# Patient Record
Sex: Male | Born: 1985 | Race: White | Hispanic: No | Smoking: Current every day smoker
Health system: Southern US, Community
[De-identification: ages and names within clinical notes are randomized; demographics above are authoritative.]

## PROBLEM LIST (undated history)

## (undated) DIAGNOSIS — F250 Schizoaffective disorder, bipolar type: Secondary | ICD-10-CM

## (undated) DIAGNOSIS — F319 Bipolar disorder, unspecified: Secondary | ICD-10-CM

## (undated) DIAGNOSIS — F259 Schizoaffective disorder, unspecified: Secondary | ICD-10-CM

## (undated) DIAGNOSIS — F988 Other specified behavioral and emotional disorders with onset usually occurring in childhood and adolescence: Secondary | ICD-10-CM

## (undated) HISTORY — PX: TONSILLECTOMY: SUR1361

---

## 2009-08-29 ENCOUNTER — Inpatient Hospital Stay: Payer: Self-pay | Admitting: Unknown Physician Specialty

## 2009-11-15 ENCOUNTER — Emergency Department: Payer: Self-pay | Admitting: Unknown Physician Specialty

## 2015-12-11 ENCOUNTER — Emergency Department (HOSPITAL_COMMUNITY)
Admission: EM | Admit: 2015-12-11 | Discharge: 2015-12-25 | Disposition: A | Discharge status: Home / Self Care | Payer: Medicaid Other | Attending: Psychiatry | Admitting: Psychiatry

## 2015-12-11 ENCOUNTER — Encounter (HOSPITAL_COMMUNITY): Payer: Self-pay | Admitting: Emergency Medicine

## 2015-12-11 DIAGNOSIS — Z79899 Other long term (current) drug therapy: Secondary | ICD-10-CM | POA: Insufficient documentation

## 2015-12-11 DIAGNOSIS — R45851 Suicidal ideations: Secondary | ICD-10-CM | POA: Diagnosis present

## 2015-12-11 DIAGNOSIS — Z9889 Other specified postprocedural states: Secondary | ICD-10-CM | POA: Diagnosis not present

## 2015-12-11 DIAGNOSIS — R45850 Homicidal ideations: Secondary | ICD-10-CM | POA: Diagnosis not present

## 2015-12-11 DIAGNOSIS — Z888 Allergy status to other drugs, medicaments and biological substances status: Secondary | ICD-10-CM | POA: Diagnosis not present

## 2015-12-11 DIAGNOSIS — F3163 Bipolar disorder, current episode mixed, severe, without psychotic features: Secondary | ICD-10-CM | POA: Diagnosis not present

## 2015-12-11 DIAGNOSIS — T1490XA Injury, unspecified, initial encounter: Secondary | ICD-10-CM

## 2015-12-11 DIAGNOSIS — F17210 Nicotine dependence, cigarettes, uncomplicated: Secondary | ICD-10-CM | POA: Diagnosis not present

## 2015-12-11 HISTORY — DX: Other specified behavioral and emotional disorders with onset usually occurring in childhood and adolescence: F98.8

## 2015-12-11 HISTORY — DX: Bipolar disorder, unspecified: F31.9

## 2015-12-11 HISTORY — DX: Schizoaffective disorder, unspecified: F25.9

## 2015-12-11 HISTORY — DX: Schizoaffective disorder, bipolar type: F25.0

## 2015-12-11 LAB — CBC
HEMATOCRIT: 42.3 % (ref 39.0–52.0)
Hemoglobin: 14.4 g/dL (ref 13.0–17.0)
MCH: 30.6 pg (ref 26.0–34.0)
MCHC: 34 g/dL (ref 30.0–36.0)
MCV: 90 fL (ref 78.0–100.0)
PLATELETS: 294 10*3/uL (ref 150–400)
RBC: 4.7 MIL/uL (ref 4.22–5.81)
RDW: 13.6 % (ref 11.5–15.5)
WBC: 8.8 10*3/uL (ref 4.0–10.5)

## 2015-12-11 LAB — COMPREHENSIVE METABOLIC PANEL
ALBUMIN: 4.2 g/dL (ref 3.5–5.0)
ALT: 20 U/L (ref 17–63)
AST: 20 U/L (ref 15–41)
Alkaline Phosphatase: 73 U/L (ref 38–126)
Anion gap: 7 (ref 5–15)
BUN: 13 mg/dL (ref 6–20)
CHLORIDE: 108 mmol/L (ref 101–111)
CO2: 26 mmol/L (ref 22–32)
Calcium: 9.4 mg/dL (ref 8.9–10.3)
Creatinine, Ser: 1.05 mg/dL (ref 0.61–1.24)
GFR calc Af Amer: >60 mL/min (ref >60)
GFR calc non Af Amer: >60 mL/min (ref >60)
GLUCOSE: 88 mg/dL (ref 65–99)
POTASSIUM: 4.2 mmol/L (ref 3.5–5.1)
SODIUM: 141 mmol/L (ref 135–145)
Total Bilirubin: 0.4 mg/dL (ref 0.3–1.2)
Total Protein: 6.8 g/dL (ref 6.5–8.1)

## 2015-12-11 LAB — ETHANOL: Alcohol, Ethyl (B): <5 mg/dL (ref <5)

## 2015-12-11 LAB — ACETAMINOPHEN LEVEL: Acetaminophen (Tylenol), Serum: <10 ug/mL — ABNORMAL LOW (ref 10–30)

## 2015-12-11 LAB — RAPID URINE DRUG SCREEN, HOSP PERFORMED
AMPHETAMINES: POSITIVE — AB
BENZODIAZEPINES: NOT DETECTED
Barbiturates: NOT DETECTED
COCAINE: NOT DETECTED
Opiates: NOT DETECTED
Tetrahydrocannabinol: POSITIVE — AB

## 2015-12-11 LAB — SALICYLATE LEVEL: Salicylate Lvl: <7 mg/dL (ref 2.8–30.0)

## 2015-12-11 LAB — CBG MONITORING, ED: GLUCOSE-CAPILLARY: 130 mg/dL — AB (ref 65–99)

## 2015-12-11 LAB — LITHIUM LEVEL: Lithium Lvl: 0.25 mmol/L — ABNORMAL LOW (ref 0.60–1.20)

## 2015-12-11 MED ORDER — LITHIUM CARBONATE 300 MG PO CAPS
1200.0000 mg | ORAL_CAPSULE | Freq: Every day | ORAL | Status: DC
  Administered 2015-12-11 – 2015-12-18 (×8): 1200 mg via ORAL
  Filled 2015-12-11 (×8): qty 4

## 2015-12-11 MED ORDER — OLANZAPINE 5 MG PO TABS
15.0000 mg | ORAL_TABLET | Freq: Every day | ORAL | Status: DC
  Administered 2015-12-11 – 2015-12-18 (×8): 15 mg via ORAL
  Filled 2015-12-11 (×4): qty 3
  Filled 2015-12-11: qty 1
  Filled 2015-12-11 (×3): qty 3

## 2015-12-11 MED ORDER — AMPHETAMINE-DEXTROAMPHETAMINE 10 MG PO TABS
15.0000 mg | ORAL_TABLET | Freq: Every day | ORAL | Status: DC
  Administered 2015-12-16 – 2015-12-18 (×3): 15 mg via ORAL
  Filled 2015-12-11 (×4): qty 2

## 2015-12-11 NOTE — ED Triage Notes (Signed)
Pt states that he got mad and  Tore up a group home  Yesterday and pt is [facility]  [facility], pt served with IVC papers, pt states he wants to kill himself

## 2015-12-11 NOTE — ED Notes (Signed)
Patient denies suicidal ideations.  When asked if he has homicidal ideations he states "I have a thing about rapists and serial killers.  I just can't stand them."  Patient refused to to answer further questions at this time and turned his body to face away from this nurse.

## 2015-12-11 NOTE — ED Provider Notes (Signed)
[facility]-EMERGENCY DEPT Provider Note   CSN: [id number] Arrival date & time: 12/11/15  1256     History   Chief Complaint Chief Complaint  Patient presents with  . Suicidal    HPI Andrew Mathews is a 30 y.o. male.  HPI  30 year old male presents today with suicidal and homicidal ideations. Patient is IVC, reports that he hears voices that other stone, and sees things in the future. He reports that he is very homicidal towards "rapists and criminals" very denies any homicidal activity, reports he occasionally has suicidal ideations none today. Patient reports that he is most safe that A inpatient facility. He notes he's been hospitalized for this in the past. Patient reports that he is taking his antipsychotic medication as directed, reports using marijuana, no other drugs. Patient denies any physical complaints, reports he's feeling well, no fever, chills nausea or vomiting.  Past Medical History:  Diagnosis Date  . Attention deficit disorder (ADD)   . Bipolar 1 disorder (HCC)   . Schizo affective schizophrenia (HCC)     There are no active problems to display for this patient.   Past Surgical History:  Procedure Laterality Date  . TONSILLECTOMY         Home Medications    Prior to Admission medications   Not on File    Family History History reviewed. No pertinent family history.  Social History Social History  Substance Use Topics  . Smoking status: Current Every Day Smoker  . Smokeless tobacco: Never Used  . Alcohol use Not on file     Allergies   Depakote er [divalproex sodium er]; Haldol [haloperidol]; Prolixin [fluphenazine]; and Wellbutrin [bupropion]   Review of Systems Review of Systems  All other systems reviewed and are negative.    Physical Exam Updated Vital Signs BP 133/80 (BP Location: Left Arm)   Pulse 100   Temp 98.4 F (36.9 C) (Oral)   Resp 16   SpO2 100%   Physical Exam  Constitutional: He is oriented to person, place, and  time. He appears well-developed and well-nourished.  HENT:  Head: Normocephalic and atraumatic.  Eyes: Conjunctivae are normal. Pupils are equal, round, and reactive to light. Right eye exhibits no discharge. Left eye exhibits no discharge. No scleral icterus.  Neck: Normal range of motion. No JVD present. No tracheal deviation present.  Pulmonary/Chest: Effort normal. No stridor.  Neurological: He is alert and oriented to person, place, and time. Coordination normal.  Psychiatric: He has a normal mood and affect. His behavior is normal. Judgment and thought content normal.  Nursing note and vitals reviewed.    ED Treatments / Results  Labs (all labs ordered are listed, but only abnormal results are displayed) Labs Reviewed  ACETAMINOPHEN LEVEL - Abnormal; Notable for the following:       Result Value   Acetaminophen (Tylenol), Serum <10 (*)    All other components within normal limits  RAPID URINE DRUG SCREEN, HOSP PERFORMED - Abnormal; Notable for the following:    Amphetamines POSITIVE (*)    Tetrahydrocannabinol POSITIVE (*)    All other components within normal limits  CBG MONITORING, ED - Abnormal; Notable for the following:    Glucose-Capillary 130 (*)    All other components within normal limits  COMPREHENSIVE METABOLIC PANEL  ETHANOL  SALICYLATE LEVEL  CBC    EKG  EKG Interpretation None       Radiology No results found.  Procedures Procedures (including critical care time)  Medications Ordered in ED  Medications - No data to display   Initial Impression / Assessment and Plan / ED Course  I have reviewed the triage vital signs and the nursing notes.  Pertinent labs & imaging results that were available during my care of the patient were reviewed by me and considered in my medical decision making (see chart for details).  Clinical Course     Patient presents with homicidal ideations. He has no physical complaints, he is medically cleared and will need  TTS evaluation  Final Clinical Impressions(s) / ED Diagnoses   Final diagnoses:  Homicidal ideations    New Prescriptions New Prescriptions   No medications on file     Eyvonne MechanicJeffrey Xara Paulding, PA-C 12/11/15 1634    Laurence Spatesachel Morgan Little, MD 12/17/15 [phone]

## 2015-12-11 NOTE — ED Notes (Signed)
Reports given to pod c rn clark

## 2015-12-11 NOTE — BH Assessment (Signed)
Pt denied at [facility] due to capacity. 

## 2015-12-11 NOTE — Progress Notes (Signed)
Referrals were submitted to the Following Hospitals: [facility], [facility], [facility], [facility], [facility], [facility], [facility], [facility], [facility], [facility], [facility], [address], [address], [facility], [facility], [facility], [facility], [facility], [facility], [facility], [facility], [facility], [facility], [facility]  Tyera Hansley K. Sherlon HandingHarris, LCAS-A, LPC-A, [facility]  Counselor 12/11/2015 7:44 PM

## 2015-12-11 NOTE — ED Notes (Signed)
Lunch tray ordered; regular diet, no sharps 
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
## 2015-12-12 NOTE — ED Notes (Signed)
Sat with pt while sitter went on break, no issues.

## 2015-12-12 NOTE — ED Notes (Signed)
Placed breakfast order for pt, per request.  Low-cholesterol eggs, Malawiturkey sausage patty, blueberry muffin, [place] Cheerios, apple, honey, soymilk, coffee

## 2015-12-12 NOTE — ED Notes (Signed)
Pt's lunch ordered 

## 2015-12-12 NOTE — BH Assessment (Signed)
Pt declined @ [facility] due to Substance use.

## 2015-12-12 NOTE — Progress Notes (Signed)
This chart was reviewed for the purpose of placement.     Maryelizabeth Rowanressa Cori Justus, MSW, Tinnie GensLCSW, LCAS, CCSI [facility] Triage Specialist [phone]

## 2015-12-12 NOTE — ED Notes (Signed)
Pt given shower supplies, new scrubs, and socks for shower. Pt given soap that he brought from home for his eczema, per RN.

## 2015-12-12 NOTE — ED Notes (Signed)
Dinner Ordered 

## 2015-12-12 NOTE — ED Notes (Signed)
Sat with pt while sitter went to lunch

## 2015-12-12 NOTE — ED Notes (Signed)
Pt up to take shower.  No problems noted

## 2015-12-12 NOTE — ED Notes (Signed)
Gave pt ginger ale.  

## 2015-12-12 NOTE — Progress Notes (Signed)
Jaylene at [facility] reports that patient is being reviewed for possible placement.  Melbourne Abtsatia Auden Tatar, LCSWA Disposition staff 12/12/2015 12:19 PM

## 2015-12-12 NOTE — ED Notes (Signed)
Pt reports he has been having intermittent chest pain since he has been here but did not inform anyone or the ER doctors. EKG performed and hand delivered to Dr. Clydene PughKnott. No new verbal orders at this time.

## 2015-12-13 MED ORDER — OLANZAPINE 5 MG PO TBDP
5.0000 mg | ORAL_TABLET | ORAL | Status: AC | PRN
Start: 2015-12-13 — End: 2015-12-13
  Administered 2015-12-13 (×2): 5 mg via ORAL
  Filled 2015-12-13 (×2): qty 1

## 2015-12-13 NOTE — ED Triage Notes (Signed)
When handing PT the 5mg  olanzapine tab . Pt reported tab was bigger than last tab . Pt was looked at  the pill package with name and dose. Pt then reported the last pill must have been 2.5 dose. Pt returned to his room pacing and talking out loud to himself.
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
## 2015-12-13 NOTE — ED Notes (Signed)
Pt in room eating meal tray.

## 2015-12-13 NOTE — ED Notes (Signed)
Patient laying in bed.  

## 2015-12-13 NOTE — ED Notes (Addendum)
Pt to restroom returns to room.continues to pace around room punched wall asked pt to stop ,pt complied. Talking to self .

## 2015-12-13 NOTE — ED Notes (Signed)
Pt in bed sleeping

## 2015-12-13 NOTE — ED Notes (Addendum)
Pt up to restroom and returns to room. Pt to receive lunch tray in bed eating.

## 2015-12-13 NOTE — ED Triage Notes (Signed)
EDP  Andrew Mathews  At Pt bed side to assess Pt.

## 2015-12-13 NOTE — ED Notes (Signed)
Pt given crackers, peanut butter and soda. Cont. Pacing in room.

## 2015-12-13 NOTE — ED Notes (Signed)
Assigned RN over to speaking with Dr Effie ShyWentz for pt eval

## 2015-12-13 NOTE — ED Triage Notes (Signed)
Bed side table and chair removed from room . Pt continues to paceing in room.

## 2015-12-13 NOTE — ED Notes (Signed)
Pt pacing and tearful in room. Security remains outside of room.

## 2015-12-13 NOTE — ED Notes (Signed)
Pt received breakfast and completed all.

## 2015-12-13 NOTE — ED Notes (Addendum)
Pt continues pacing in room. Security remains.

## 2015-12-13 NOTE — ED Notes (Signed)
Patient slamming cabinet doors, requested to stop. RN asked patient if he was agitated and patient yelling " Yes I am agitated and slamming doors". Security called. Patient given space.

## 2015-12-13 NOTE — ED Triage Notes (Addendum)
PT continues to pac in room. Next dose of zyprexa due at 1030.

## 2015-12-13 NOTE — BH Assessment (Signed)
Per [place], [facility] is [facility] the patient for placement.

## 2015-12-13 NOTE — ED Notes (Signed)
Pt continues pacing in room.

## 2015-12-13 NOTE — ED Notes (Signed)
telepysch in progress. 

## 2015-12-13 NOTE — ED Triage Notes (Signed)
PT contiunes to pace in room waving arms above head. . Pt walked to bathroom.

## 2015-12-13 NOTE — ED Notes (Signed)
telepysch completed pt in bed.

## 2015-12-13 NOTE — BHH Counselor (Signed)
Attempted reassessment of Pt.  Per attending nurse's report, Pt was aggressive this morning and as given medication to calm him.  He is too drowsy for reassessment.

## 2015-12-13 NOTE — ED Triage Notes (Signed)
PT resting in his bed now.
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
## 2015-12-13 NOTE — ED Notes (Signed)
Dr.Wentz at bedside, pt continues pacing.

## 2015-12-13 NOTE — ED Notes (Signed)
Pt in bed attempting to sleep.

## 2015-12-13 NOTE — ED Notes (Addendum)
Pt pacing around room and talking to himself cursing Andrew ManisElizabeth RN aware at nurses station.

## 2015-12-13 NOTE — ED Notes (Signed)
Pt to use restroom cont. Pacing in room.
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
## 2015-12-14 NOTE — Progress Notes (Signed)
Followed up on inpatient referrals.  [facility] being [facility] (per [facility]) and [facility] ( per [place]) today.   Decl[facility] at [facility] (both due to SA) and [facility] (due to not having IPRS funding).  [facility] upon appropriate bed availability.  Ilean SkillMeghan Debra Calabretta, MSW, LCSW Clinical Social Work, Disposition  12/14/2015 [phone]

## 2015-12-14 NOTE — ED Notes (Signed)
Regular Diet was ordered for patient for Lunch, and a snack and drink was given to patient.

## 2015-12-14 NOTE — ED Notes (Signed)
Pt OOB to BR with steady gait.

## 2015-12-14 NOTE — ED Notes (Signed)
Ordered breakfast tray per pt request. Blueberry pancakes w/butter, Malawiturkey sausage, low-cholesterol eggs, Frosted Flakes, soy milk, coffee (3 creams, 2 sugars), and 3 honey packs.

## 2015-12-15 MED ORDER — LORAZEPAM 1 MG PO TABS
2.0000 mg | ORAL_TABLET | Freq: Once | ORAL | Status: AC
  Administered 2015-12-15: 2 mg via ORAL
  Filled 2015-12-15: qty 2

## 2015-12-15 NOTE — ED Notes (Signed)
TSS called this RN to notify that pt is wait listed for [facility].  Pt's custody order needs to be redone as it is outdated at this time.
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
## 2015-12-15 NOTE — ED Notes (Signed)
Unable to get patient V/S at this time, due To behavior problems.

## 2015-12-15 NOTE — ED Notes (Signed)
Pt is now resting in bed quietly, informed the patient that if he feels like he is going to start feeling that way again to let me know so I can give him something before it escalates again. Verbalized understanding. Patient given a gingerale.

## 2015-12-15 NOTE — ED Notes (Signed)
Patient was given a snack and drink, and a regular diet was taken for dinner.

## 2015-12-15 NOTE — ED Notes (Signed)
tts at bedside 

## 2015-12-15 NOTE — ED Notes (Signed)
Patient was given a snack and a drink, a regular diet was ordered for Lunch.

## 2015-12-15 NOTE — Progress Notes (Signed)
[facility], per Fritzi MandesKirsten. Kirsten from [facility] called back Clinical research associatewriter and informed that pt's custody order has been served late and that it needs to be redone. [facility] RN Philippa ChesterBrittney has been informed.  Authorization number from [employer] has been obtained: [id number] from 12/15/15 through admission date. [facility] and demographics haven been given to [facility].   Andrew Mathews, LCSWA Disposition staff 12/15/2015 7:03 PM

## 2015-12-15 NOTE — ED Notes (Signed)
In reviewing the IVC paperwork with charge nurse, case manager, and social worker, it is been determined that the "custody order" is contained within the IVC paperwork.  The IVC paperwork is still valid at this time and will need to be resubmitted on Thursday 12/15 in the morning.

## 2015-12-15 NOTE — BH Assessment (Signed)
Writer reassessed pt today. Pt wearing scrubs and lying on his side in bed. He is cooperative at first. Pt reports he slept well and he has a good appetite. When writer asks why pt is in ED, pt says, "I don't want to talk about it. I already talked about it." When asked about SI, pt says, "I already told y'al that. I don't want to talk about it. " Pt's affect is blunted. He describes mood as "good, it's alright." Pt reports he was kicked out of his group home, and he has nowhere to go. He asks if he is being admitted to long term care or the state hospital. Writer explains disposition process and tell pt that his referral has been sent to 10+ hospitals. Writer ran pt by Fransisca KaufmannLaura Davis NP who recommends inpatient treatment. TTS continues to seek placement.

## 2015-12-15 NOTE — ED Notes (Signed)
Pt ambulated to the bathroom and ate all of his lunch, denies any complaints

## 2015-12-15 NOTE — ED Notes (Signed)
Pt is still pacing the room, no longer talking to himself or throwing things, security close to bedside.

## 2015-12-15 NOTE — Progress Notes (Addendum)
Spoke with Irish Lackenisha at [facility]. Advises "Tried to call after hours yesterday to accept pt and could not reach anyone." Asks to re-fax referral so that it can be re-considered for admission today.   Ilean SkillMeghan Branko Steeves, MSW, LCSW Clinical Social Work, Disposition  12/15/2015 [phone]  Irish Lackenisha states pt has now been declined for admission, acuity too high for unit. Also declined at [facility] [place], [facility] (all due to SA), [employer] [employer] [facility] (no [employer] funding). [facility] (per Morrie SheldonAshley limited [employer] funding, admissions will review) and [facility] (per Thayer Ohmhris, possibility of one male bed opening later today).  [facility]. Pt listed as uninsured and had [place] address per [employer] representative Lauren, however Lauren states pt currentlly listed in their [place] [employer] system as having [place] MCD (unable to provide MCD number due to confidentiality constraints).  Address in pt's chart lists  address, therefore faxed authorization request to [facility] at [phone]. Noted that [facility] system lists pt uninsured but reportedly [place] [employer] lists pt as having MCD.  Will proceed with [facility] referral once authorization # granted.

## 2015-12-16 ENCOUNTER — Emergency Department (HOSPITAL_COMMUNITY): Payer: Medicaid Other

## 2015-12-16 ENCOUNTER — Inpatient Hospital Stay (HOSPITAL_COMMUNITY)
Admit: 2015-12-16 | Payer: No Typology Code available for payment source | Source: Intra-hospital | Admitting: Psychiatry

## 2015-12-16 DIAGNOSIS — F3163 Bipolar disorder, current episode mixed, severe, without psychotic features: Secondary | ICD-10-CM | POA: Diagnosis present

## 2015-12-16 MED ORDER — LORAZEPAM 1 MG PO TABS
1.0000 mg | ORAL_TABLET | ORAL | Status: AC | PRN
  Administered 2015-12-16: 1 mg via ORAL
  Filled 2015-12-16: qty 1

## 2015-12-16 MED ORDER — ZIPRASIDONE MESYLATE 20 MG IM SOLR
20.0000 mg | INTRAMUSCULAR | Status: AC | PRN
  Administered 2015-12-16: 20 mg via INTRAMUSCULAR
  Filled 2015-12-16: qty 20

## 2015-12-16 MED ORDER — OLANZAPINE 5 MG PO TBDP
5.0000 mg | ORAL_TABLET | Freq: Three times a day (TID) | ORAL | Status: DC | PRN
  Administered 2015-12-16 – 2015-12-18 (×4): 5 mg via ORAL
  Filled 2015-12-16 (×4): qty 1

## 2015-12-16 MED ORDER — STERILE WATER FOR INJECTION IJ SOLN
INTRAMUSCULAR | Status: AC
  Filled 2015-12-16: qty 10

## 2015-12-16 NOTE — ED Notes (Signed)
Pt requesting to shower at this time, given toiletries for showering.

## 2015-12-16 NOTE — ED Notes (Signed)
Dr. Clayborne DanaMesner at bedside updating patient and speaking to patient about his request for prn atarax.

## 2015-12-16 NOTE — ED Notes (Signed)
Pt acting out, punching walls and yelling. EDP is speaking with the pt. Security is outside of room.

## 2015-12-16 NOTE — [facility] Counselor (Signed)
Re-assessment- Pt denies SI. Pt states "I still want to kill pedophiles." Pt states "my brain is not right, I need help." Pt denies AVH.  Andrew PhoenixBrandi Sharniece Gibbon, [facility] Triage Specialist

## 2015-12-16 NOTE — ED Notes (Signed)
Pt has received a snack, lunch order has been taken at this time.  

## 2015-12-16 NOTE — ED Triage Notes (Signed)
PT refuses  zyprexa  At this time.

## 2015-12-16 NOTE — Progress Notes (Signed)
Pt remains on [facility] waiting list per Robinette.   Declined: [facility]  Will continue following and seeking inpatient placement.  Ilean SkillMeghan Carzell Saldivar, MSW, LCSW Clinical Social Work, Disposition  12/16/2015 [phone]

## 2015-12-16 NOTE — ED Notes (Signed)
[facility] until 2200.

## 2015-12-16 NOTE — ED Notes (Signed)
Pt up to restroom.  Continue to reassure.  Patient has moments of maniacal laughing in room, patient has moments of yelling angrily.  Then patient will be calm when reassured by RN

## 2015-12-16 NOTE — ED Notes (Signed)
Pt received breakfast at this time

## 2015-12-16 NOTE — Progress Notes (Signed)
[facility] by Dr. Lucianne MussKumar, attending Dr. Elna BreslowEappen. [facility]  Report # [id number]. Pt under IVC. [facility] aware of plan to transfer tonight.   Ilean SkillMeghan Tyreanna Bisesi, MSW, LCSW Clinical Social Work, Disposition  12/16/2015 [phone]

## 2015-12-16 NOTE — ED Notes (Addendum)
[facility] called to request that ED hold pt while they discuss [facility] care [facility] with [facility].

## 2015-12-16 NOTE — ED Notes (Signed)
Food tray at bedside.

## 2015-12-16 NOTE — ED Provider Notes (Signed)
4:59 PM Reevaluated patient and he wanted to know What the plan of action is. I discussed with him that the plan was to go to behavior health tonight around 8:00 and he is okay with that. He is still having homicidal thoughts that are becoming more pervasive. He is excited about hopefully get some help with this.   Marily MemosJason Laurena Valko, MD 12/16/15 1700

## 2015-12-16 NOTE — ED Notes (Signed)
TTS set up at bedside to assess pt.

## 2015-12-16 NOTE — ED Notes (Signed)
MD made aware of pt's request for consultation on POC.

## 2015-12-16 NOTE — ED Notes (Signed)
Pt eating left overs from food tray.

## 2015-12-16 NOTE — ED Notes (Signed)
Patient pacing in room upon RN arrival.  Pt then struck the table and wall in room.  This RN and Schering-PloughCrystal, RN and Kathlene NovemberMike, RN approached patient.  Patient verbally aggressive and threatening towards staff.  Security paged and at bedside.  Pt standing in room, no longer hitting, but still using aggressive language.  Pt offered Zyprexa and Ativan PO.  Patient considering at this time.  This RN encouraged pt to take PO meds to avoid IM medications.  Patient states "I'm willing to just let that happen.  You all are going to just dope me up and strap me down and laugh at me".   This RN attempted to reassure patient.

## 2015-12-16 NOTE — ED Notes (Signed)
[facility] is refusing to take pt. [facility] AC states that MD Lucianne MussKumar was not aware that pt had become violent. This RN informed to given pt his scheduled antipsychotics and they will reevaluate in the am.

## 2015-12-16 NOTE — ED Notes (Signed)
Report attempted x 1

## 2015-12-17 MED ORDER — LORAZEPAM 1 MG PO TABS
1.0000 mg | ORAL_TABLET | Freq: Four times a day (QID) | ORAL | Status: DC | PRN
  Administered 2015-12-17 – 2015-12-18 (×5): 1 mg via ORAL
  Filled 2015-12-17 (×5): qty 1

## 2015-12-17 NOTE — ED Notes (Signed)
Patient clam at this time. Patient given ice cream as snack. Patient refused to speak for assessment. When ask how does he feel patient states, " I feel Calm". RN then ask patient how has he been sleeping, Patient states, " I been sleeping to much". RN then ask patient how has he has been eating. Patient states Okay. RN then has been seeing anything not there or hearing voices. Patient did not answer, RN then ask if patient was SI / HI patient states I do not want to talk about it.

## 2015-12-17 NOTE — ED Notes (Addendum)
[employer] officer stated to this RN that the pt told him as he first entered the room, "I was hoping to hit a cop." He additionally told the officer, he wants to kill child molesters and child abusers.  That he wanted to protect all of the children.  [employer] officer was able to fully deescalate the pt.

## 2015-12-17 NOTE — ED Notes (Signed)
Patient was tearful, Rn asked patient how was he feeling, patient responded I don't want to talk about RN ask if given some time would he like to talk. Patient states, " [facility]" . RN advised it could take some time for a bed to become available.  Patient then requested Ice Cream RN gave ice cream. Patient Calm at this time.

## 2015-12-17 NOTE — ED Notes (Signed)
Patient dinner at bedside,

## 2015-12-17 NOTE — Progress Notes (Addendum)
RN [facility]nie [facility] informed Clinical research associatewriter that she will call back when she finds bed.  Melbourne Abtsatia Klaire Court, LCSWA Disposition staff 12/17/2015 8:26 AM

## 2015-12-17 NOTE — ED Notes (Signed)
Patient given snack.  

## 2015-12-17 NOTE — ED Notes (Signed)
Pt became increasingly agitated, requesting anxiety medication, and pacing quickly in his room.  This RN encouraged slow breathing and gave him Ativan.  Pt started crying.  This RN attempted to have the pt communicate what happened to cause this increase in anxiety and pt replied he couldn't talk to me or explain anything.  This RN attempted to have the [employer] officer speak with pt again due to previous good rapport but he requested to be left alone.  Pt continues increasing pacing and agitation in room, hitting himself in his head.  Notified Dr Erma HeritageIsaacs who came to bedside to speak with pt.  Dr Erma HeritageIsaacs is going to try to speak with psychiatry and then advise next steps in plan of care.

## 2015-12-17 NOTE — ED Notes (Signed)
Updated pt about possibly having a bed at [facility] today [facility].

## 2015-12-17 NOTE — ED Notes (Signed)
Pt laying calming in bed.

## 2015-12-17 NOTE — ED Notes (Signed)
Per Lindsay, [facility], pt remains on [facility] list and [facility].

## 2015-12-17 NOTE — ED Notes (Signed)
Patient was given a snack and drink, and a Regular Diet was ordered for Lunch. 

## 2015-12-17 NOTE — ED Notes (Signed)
Dr Clydene PughKnott at bedside.  Pt becoming calmer from speaking with [employer] and now willing to take PO medications.

## 2015-12-17 NOTE — ED Notes (Signed)
Pt has been updated on plan of care and acknowledged understand.  Pt has been slowly pacing in his room since waking up to eat breakfast but calm and cooperative at this time with this RN.

## 2015-12-17 NOTE — ED Notes (Signed)
Patient ambulated to restroom.

## 2015-12-17 NOTE — ED Provider Notes (Signed)
Notified by nursing of patient agitation. I reviewed patient's records. He has a history of recurrent episodes of agitation requiring when necessary medications. Given that he has been here for several days, I feel formal psychiatric consultation for medication reconciliation is indicated. I discussed with [facility] and they will notify the psychiatrist. Otherwise, will continue when necessary medications and verbal deescalation.   Shaune Pollackameron Amar Sippel, MD 12/17/15 [phone]

## 2015-12-17 NOTE — ED Notes (Addendum)
Pt beginning to become increasingly agitated and yelling while talking to himself.  This RN and other staff heard a bang in the room at which time [employer] entered room to speak with pt.

## 2015-12-17 NOTE — ED Notes (Signed)
Pt to desk asking for update on dispo. Pt calm and cooperative with good eye contact. Informed pt this RN not sure but will investigate about dispo. Pt agrees to this. Walking around nurses station with sitter. States he just feels a little anxious, but does not appear so.

## 2015-12-17 NOTE — ED Notes (Signed)
Patient ambulated to the restroom

## 2015-12-17 NOTE — ED Provider Notes (Signed)
I was called for pt becoming increasingly agitated. He is getting zyprexa prn q8h for breakthrough agitation. Will add 1 mg ativan prn q6h to suppress his breakthrough anxiety.    Lyndal Pulleyaniel Ashaki Frosch, MD 12/17/15 [phone]

## 2015-12-17 NOTE — Progress Notes (Signed)
Patient ID: Andrew Mathews, male   DOB: 08/24/1985, 30 y.o.   MRN: [id number]  Per chart review, patient exhibiting increased verbal and physical aggression towards property and staff since acceptance. Continuous monitoring of patient by security officer needed at ED. Per review by administration and [facility], unit acuity and staffing do not afford a safe admission at this time. Will re-assess in the morning.

## 2015-12-17 NOTE — ED Notes (Signed)
Pt began pacing in his room again approx 10 minutes ago.

## 2015-12-18 MED ORDER — ZIPRASIDONE MESYLATE 20 MG IM SOLR
10.0000 mg | Freq: Once | INTRAMUSCULAR | Status: AC
  Administered 2015-12-18: 10 mg via INTRAMUSCULAR
  Filled 2015-12-18: qty 20

## 2015-12-18 MED ORDER — LORAZEPAM 2 MG/ML IJ SOLN
1.0000 mg | Freq: Once | INTRAMUSCULAR | Status: AC
  Administered 2015-12-18: 1 mg via INTRAMUSCULAR
  Filled 2015-12-18: qty 1

## 2015-12-18 MED ORDER — STERILE WATER FOR INJECTION IJ SOLN
INTRAMUSCULAR | Status: AC
  Administered 2015-12-18: 10 mL
  Filled 2015-12-18: qty 10

## 2015-12-18 NOTE — ED Notes (Signed)
Pt resting at present- laying on bed, no complaints, sitter at bedside,.

## 2015-12-18 NOTE — ED Notes (Signed)
Hourly rounding reveals patient in room. No complaints, stable, in no acute distress. Q15 minute rounds and monitoring via Security Cameras to continue. 

## 2015-12-18 NOTE — ED Notes (Signed)
Report to include Situation, Background, Assessment, and Recommendations received from [facility]. Patient alert and oriented, warm and dry, in no acute distress. Patient denies SI, and pain. Patient states he wants to "hurt pedifiles"  hears voices without command and sees "spirits". Patient made aware of Q15 minute rounds and security cameras for their safety. Patient instructed to come to me with needs or concerns.

## 2015-12-18 NOTE — ED Notes (Signed)
Pt eating lunch

## 2015-12-18 NOTE — ED Notes (Signed)
Pt given Ativan 1mg  IM right deltoid per Italyhad Grose, RN, geodon 10mg  IM in left deltoid per Anell BarrKaren Andranik Jeune, RN--  Pt continually yelling, screaming, punching cabinets,

## 2015-12-18 NOTE — ED Notes (Signed)
Report given to [place], Charity fundraiserN at [facility].

## 2015-12-18 NOTE — ED Notes (Signed)
Pt. C/o anxiety. 

## 2015-12-18 NOTE — ED Notes (Signed)
ivc paperwork received by magistrate--

## 2015-12-18 NOTE — ED Notes (Signed)
Pt pacing, hitting cabinets, yelling,

## 2015-12-18 NOTE — ED Notes (Signed)
Patient sleeping

## 2015-12-18 NOTE — ED Notes (Signed)
Patient lying in bed at this time.

## 2015-12-18 NOTE — ED Notes (Signed)
Hourly rounding reveals patient sleeping in room. No complaints, stable, in no acute distress. Q15 minute rounds and monitoring via Security Cameras to continue. 

## 2015-12-18 NOTE — ED Notes (Signed)
Pt arrived with [employer],  He was calm and cooperative upon arrival.  Pt was oriented to room and unit.  Pt was explained units rules.  15 minute checks and video monitoring in place.

## 2015-12-18 NOTE — ED Notes (Signed)
Patient eating at this time.

## 2015-12-18 NOTE — ED Notes (Signed)
Pt is cooperative, ambulating to bathroom.,

## 2015-12-18 NOTE — ED Notes (Signed)
Pt transported to [facility] via [employer] -- Edie notified of departure.

## 2015-12-18 NOTE — ED Notes (Signed)
Pt sleeping at present-- spoke with Ava, pt is unable to be re-assessed at present.

## 2015-12-19 DIAGNOSIS — Z888 Allergy status to other drugs, medicaments and biological substances status: Secondary | ICD-10-CM | POA: Diagnosis not present

## 2015-12-19 DIAGNOSIS — F3163 Bipolar disorder, current episode mixed, severe, without psychotic features: Secondary | ICD-10-CM

## 2015-12-19 DIAGNOSIS — Z79899 Other long term (current) drug therapy: Secondary | ICD-10-CM

## 2015-12-19 DIAGNOSIS — F17210 Nicotine dependence, cigarettes, uncomplicated: Secondary | ICD-10-CM

## 2015-12-19 DIAGNOSIS — R45851 Suicidal ideations: Secondary | ICD-10-CM

## 2015-12-19 DIAGNOSIS — R45850 Homicidal ideations: Secondary | ICD-10-CM

## 2015-12-19 MED ORDER — ZIPRASIDONE MESYLATE 20 MG IM SOLR
20.0000 mg | Freq: Once | INTRAMUSCULAR | Status: AC
  Administered 2015-12-19: 20 mg via INTRAMUSCULAR
  Filled 2015-12-19: qty 20

## 2015-12-19 MED ORDER — OLANZAPINE 10 MG PO TABS
10.0000 mg | ORAL_TABLET | Freq: Two times a day (BID) | ORAL | Status: DC
Start: 2015-12-19 — End: 2015-12-25
  Administered 2015-12-19 – 2015-12-25 (×12): 10 mg via ORAL
  Filled 2015-12-19 (×12): qty 1

## 2015-12-19 MED ORDER — LITHIUM CARBONATE 300 MG PO CAPS
600.0000 mg | ORAL_CAPSULE | Freq: Two times a day (BID) | ORAL | Status: DC
  Administered 2015-12-19 – 2015-12-25 (×12): 600 mg via ORAL
  Filled 2015-12-19 (×12): qty 2

## 2015-12-19 MED ORDER — LORAZEPAM 2 MG/ML IJ SOLN
2.0000 mg | Freq: Once | INTRAMUSCULAR | Status: AC
  Administered 2015-12-19: 2 mg via INTRAMUSCULAR
  Filled 2015-12-19: qty 1

## 2015-12-19 MED ORDER — DIPHENHYDRAMINE HCL 50 MG/ML IJ SOLN
50.0000 mg | Freq: Once | INTRAMUSCULAR | Status: AC
  Administered 2015-12-19: 50 mg via INTRAMUSCULAR
  Filled 2015-12-19: qty 1

## 2015-12-19 MED ORDER — HYDROXYZINE HCL 25 MG PO TABS: 25.0000 mg | ORAL_TABLET | Freq: Three times a day (TID) | ORAL | Status: DC | PRN

## 2015-12-19 MED ORDER — HYDROXYZINE HCL 25 MG PO TABS
25.0000 mg | ORAL_TABLET | Freq: Three times a day (TID) | ORAL | Status: DC
Start: 2015-12-19 — End: 2015-12-25
  Administered 2015-12-19 – 2015-12-25 (×16): 25 mg via ORAL
  Filled 2015-12-19 (×17): qty 1

## 2015-12-19 MED ORDER — OLANZAPINE 10 MG PO TBDP
10.0000 mg | ORAL_TABLET | Freq: Three times a day (TID) | ORAL | Status: DC | PRN
  Administered 2015-12-19 – 2015-12-21 (×3): 10 mg via ORAL
  Filled 2015-12-19 (×4): qty 1

## 2015-12-19 MED ORDER — AMANTADINE HCL 100 MG PO CAPS
100.0000 mg | ORAL_CAPSULE | Freq: Two times a day (BID) | ORAL | Status: DC
  Administered 2015-12-19 – 2015-12-25 (×11): 100 mg via ORAL
  Filled 2015-12-19 (×12): qty 1

## 2015-12-19 NOTE — ED Notes (Signed)
Hourly rounding reveals patient sleeping in room. No complaints, stable, in no acute distress. Q15 minute rounds and monitoring via Security Cameras to continue. 

## 2015-12-19 NOTE — Progress Notes (Signed)
[facility], per intake John.  Melbourne Abtsatia Nelda Luckey, LCSWA Disposition staff 12/19/2015 1:37 PM

## 2015-12-19 NOTE — ED Notes (Signed)
Patient used the phone this morning and became angry with who he was talking with.  He started cussing and raising his voice.  I asked him to please stop cussing and to lower his voice.  He then started yelling and slammed the phone down.  He went into his room and started to shove furniture around and rip his clothes.  He was screaming "Fuck" repeatedly and in a very loud tone.  Medications ordered and given with security and police present.  He sat on the bed and took the injections of ziprasidone, diphenhydramine, and lorazepam without resistance.  He then took a shower and is currently resting quietly on his bed.   Note was originally written in wrong chart, then copied and pasted.

## 2015-12-19 NOTE — ED Notes (Signed)
Hourly rounding reveals patient in room. No complaints, stable, in no acute distress. Q15 minute rounds and monitoring via Security Cameras to continue. 

## 2015-12-19 NOTE — ED Notes (Signed)
Patient slept much of the day, but woke up just before dinner.  He has been pacing with occasional outbursts of cursing.  He refused his vital signs, but ate all of his dinner and took all of his scheduled medications.

## 2015-12-19 NOTE — Consult Note (Signed)
[facility]to[facility]iat[facility] Consult   Reason for Consult: psychiatric evaluation Referring Physician:  EDP Patient Identification: Andrew Mathews Mezquita MRN:  [id number] Principal Diagnosis: Bipolar 1 disorder, mixed, severe (HCC) Diagnosis:   Patient Active Problem List   Diagnosis Date Noted  . Bipolar 1 disorder, mixed, severe (HCC) [F31.63] 12/16/2015    Priority: High    Total Time spent with patient: 45 minutes  Subjective:   Andrew Mathews Castronovo is a 30 y.o. male patient admitted with homicidal/suicidal thoughts and mood lability.  HPI: Patient with history of Bipolar disorder-Mania. [facility], [facility]. Patient reports having anger problem, racing thoughts, sleep problem, having suicidal and homicidal thoughts. Patient reports that he is "sick in my mind, having fantasies of killing child molesters, rapist..Marland Kitchen.I'm on the verge of being a serial killer". Pt reports that he a plan to  find these people by going on the sex offender website, where their pictures and addresses pop up and hunt them down. Pt declined to name who wants to kill in particular. Patient also reports seeing images of children of different colors and hearing voices of a little girl  saying to him "it ain't worth it". Pt indicates that he has had these homicidal feelings and AVH ever since he was 19, but he can't seem to control the feelings, at this time. Patient gets easily agitated, yells, screams and punch stuffs since [facility]. Patient [facility].   Past Psychiatric History: as above  Risk to Self: Suicidal Ideation: Yes Suicidal Intent: No Is patient at risk for suicide?: No Suicidal Plan?: No Access to Means: No What has been your use of drugs/alcohol within the last 12 months?: see above How many times?: 12 Triggers for Past Attempts:  Unknown Intentional Self Injurious Behavior: None Risk to Others: Homicidal Ideation: Yes-Currently Present Thoughts of Harm to Others: Yes-Currently Present Comment - Thoughts of Harm to Others: see narrative Current Homicidal Intent: Yes-Currently Present Current Homicidal Plan: Yes-Currently Present Describe Current Homicidal Plan: unclear Access to Homicidal Means: Yes Describe Access to Homicidal Means: unclear Identified Victim: sex offenders, rapists, pedophiles History of harm to others?: No Assessment of Violence: None Noted Criminal Charges Pending?: No Does patient have a court date: No Prior Inpatient Therapy: Prior Inpatient Therapy: Yes Prior Therapy Dates: unspecified Prior Therapy Facilty/Provider(s): [facility], possible [facility] Prior Outpatient Therapy: Prior Outpatient Therapy: No Does patient have Intensive In-House Services?  : No Does patient have [facility] services? : No Does patient have P4CC services?: No  Past Medical History:  Past Medical History:  Diagnosis Date  . Attention deficit disorder (ADD)   . Bipolar 1 disorder (HCC)   . Schizo affective schizophrenia [facility](HCC)     Past Surgical History:  Procedure Laterality Date  . TONSILLECTOMY     Family History: History reviewed. No pertinent family history. Family Psychiatric  History:  Social History:  History  Alcohol use Not on file     History  Drug Use  . Types: Marijuana    Social History   Social History  . Marital status: Unknown    Spouse name: N/A  . Number of children: N/A  . Years of education: N/A   Social History Main Topics  . Smoking status: Current Every Day Smoker  . Smokeless tobacco: Never Used  . Alcohol use None  . Drug use:  Types: Marijuana  . Sexual activity: Not Asked   Other Topics Concern  . None   Social History Narrative  . None   Additional Social History:    Allergies:   Allergies  Allergen Reactions  . Prolixin [Fluphenazine] Palpitations and Other  (See Comments)    bradycardia  . Divalproex Sodium [Valproic Acid] Nausea And Vomiting  . Haldol [Haloperidol] Other (See Comments)    Muscle cramps  . Wellbutrin [Bupropion] Hives and Rash    Labs: No results found for this or any previous visit (from the past 48 hour(s)).  Current Facility-Administered Medications  Medication Dose Route Frequency Provider Last Rate Last Dose  . amantadine (SYMMETREL) capsule 100 mg  100 mg Oral BID Bernadette Armijo, MD      . hydrOXYzine (ATARAX/VISTARIL) tabThedore Minslet 25 mg  25 mg Oral TID Thedore MinsMojeed Terica Yogi, MD      . lithium carbonate capsule 600 mg  600 mg Oral BID WC Solenne Manwarren, MD      . OLANZapine (ZYPREXA) tablet 10 mg  10 mg Oral BID PC Quinisha Mould, MD      . OLANZapine zydis (ZYPREXA) disintegrating tablet 10 mg  10 mg Oral Q8H PRN Thedore MinsMojeed Kenleigh Toback, MD       Current Outpatient Prescriptions  Medication Sig Dispense Refill  . amphetamine-dextroamphetamine (ADDERALL) 15 MG tablet Take 15 mg by mouth every morning.     . chlorproMAZINE (THORAZINE) 50 MG tablet Take 50 mg by mouth 2 (two) times daily.    Marland Kitchen. lithium 600 MG capsule Take 1,200 mg by mouth at bedtime.    Marland Kitchen. OLANZapine (ZYPREXA) 15 MG tablet Take 15 mg by mouth at bedtime.      Musculoskeletal: Strength & Muscle Tone: within normal limits Gait & Station: normal Patient leans: N/A  Psychiatric Specialty Exam: Physical Exam  Psychiatric: His affect is angry and labile. His speech is rapid and/or pressured. He is agitated, aggressive, actively hallucinating and combative. Thought content is paranoid and delusional. Cognition and memory are normal. He expresses impulsivity. He expresses homicidal ideation. He expresses homicidal plans.    Review of Systems  Constitutional: Negative.   HENT: Negative.   Eyes: Negative.   Respiratory: Negative.   Cardiovascular: Negative.   Gastrointestinal: Negative.   Genitourinary: Negative.   Musculoskeletal: Negative.   Skin: Negative.    Neurological: Negative.   Endo/Heme/Allergies: Negative.   Psychiatric/Behavioral: Positive for hallucinations, substance abuse and suicidal ideas.    Blood pressure 117/59, pulse 75, temperature 97.6 F (36.4 C), temperature source Oral, resp. rate 17, SpO2 98 %.There is no height or weight on file to calculate BMI.  General Appearance: Casual  Eye Contact:  Good  Speech:  Clear and Coherent  Volume:  Increased  Mood:  Angry and Irritable  Affect:  Labile and Full Range  Thought Process:  Disorganized  Orientation:  Full (Time, Place, and Person)  Thought Content:  Illogical  Suicidal Thoughts:  Yes.  without intent/plan  Homicidal Thoughts:  Yes.  with intent/plan  Memory:  Immediate;   Fair Recent;   Fair Remote;   Fair  Judgement:  Poor  Insight:  Lacking  Psychomotor Activity:  Increased  Concentration:  Concentration: Poor and Attention Span: Fair  Recall:  FiservFair  Fund of Knowledge:  Fair  Language:  Good  Akathisia:  No  Handed:  Right  AIMS (if indicated):     Assets:  Communication Skills  ADL's:  Intact  Cognition:  WNL  Sleep:   poor  Treatment Plan Summary: Daily contact with patient to assess and evaluate symptoms and progress in treatment and Medication management  Start Amantadine 100 mg bid for EPS prevention Change Lithium to 600 MG BID for Bipolar Continue Hydroxyzine 25 mg TID for agitation. Increase Zyprexa to 10 mg bid for psychosis/delusions   Disposition: Recommend psychiatric Inpatient admission when medically cleared. Awaiting [facility] in [facility]  Thedore Mins, MD 12/19/2015 11:46 AM

## 2015-12-19 NOTE — ED Notes (Signed)
Report to include Situation, Background, Assessment, and Recommendations received from [facility]. Patient alert and oriented, warm and dry, in no acute distress. Patient denies HI, AVH and pain. States he still wants to "kill pediphiles". Patient made aware of Q15 minute rounds and security cameras for their safety. Patient instructed to come to me with needs or concerns.

## 2015-12-20 NOTE — ED Notes (Signed)
Hourly rounding reveals patient sleeping in room. No complaints, stable, in no acute distress. Q15 minute rounds and monitoring via Security Cameras to continue.l 

## 2015-12-20 NOTE — ED Notes (Signed)
Hourly rounding reveals patient sleeping in room. No complaints, stable, in no acute distress. Q15 minute rounds and monitoring via Security Cameras to continue. 
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
## 2015-12-20 NOTE — ED Notes (Signed)
Pt in his room loud and aggressive talking and cursing to himself. Asked pt what wrong wrong. Pt stated that he is angry because he can no longer work out because he injured his wrist. Says that he is a muslim and [place] [facility] [place] had no choice. Pt was given all of his am psych meds and was easily re-directed by MHT and Nurse to calm down.

## 2015-12-20 NOTE — ED Notes (Signed)
Report to include Situation, Background, Assessment, and Recommendations received from [facility]. Patient alert and oriented, warm and dry, in no acute distress. Patient denies SI, AVH and pain. Patient states he still wants to hurt "pediphiles".  Patient made aware of Q15 minute rounds and security cameras for their safety. Patient instructed to come to me with needs or concerns.

## 2015-12-20 NOTE — ED Notes (Signed)
Hourly rounding reveals patient in room. No complaints, stable, in no acute distress. Q15 minute rounds and monitoring via Security Cameras to continue. 

## 2015-12-21 NOTE — ED Notes (Signed)
Pt A&O x 3, no distress noted, calm & cooperative, watching TV at present.  Monitoring for safety, Q 15 min checks in effect. 

## 2015-12-21 NOTE — ED Notes (Signed)
Hourly rounding reveals patient sleeping in room. No complaints, stable, in no acute distress. Q15 minute rounds and monitoring via Security Cameras to continue. 

## 2015-12-21 NOTE — Progress Notes (Signed)
Pt awaken CM spoke with [place].  CM discussed and provided written information to assist pt with determining choice for uninsured accepting [place], [place] Provided a list of free clinics in  areas Pt voiced understanding and appreciation of resources provided Entered in pt locker #35  Provided [facility] contact information

## 2015-12-21 NOTE — BH Assessment (Signed)
[facility] Assessment Progress Note  At 09:20 Sonya at [facility] reports that pt remains on their wait list.  Momo Braun, MA Triage Specialist [phone]     

## 2015-12-21 NOTE — ED Notes (Signed)
Hourly rounding reveals patient asleep in room. No complaints, stable, in no acute distress. Q15 minute rounds and monitoring via Security Cameras to continue. 

## 2015-12-21 NOTE — Progress Notes (Signed)
12/21/15 1402:  LRT went to pt room to offer activities, pt was sleep.  Caroll RancherMarjette Vangie Henthorn, LRT/CTRS

## 2015-12-21 NOTE — BH Assessment (Addendum)
[facility] Assessment Progress Note Patient was seen earlier this date by this writer to assess current treatment progress. Patient stated he continues to have fantasies or dreams about "hurting people who hurt kids." Patient stated "I know it isn't right, but it is right for me." Patient presents with an anxious affect as he engages with this Clinical research associatewriter. Patient often looses focus and requires redirection. Case was staffed with Jannifer FranklinAkintayo MD who recommends continued inpatient admission as [facility].

## 2015-12-21 NOTE — ED Notes (Signed)
Attempted to give prn due to loud talking in room and hitting walls.  Pt refused Zyprexa but agreed to calm down.  Pt denies hearing voices , he states that he is talking to his self.
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
## 2015-12-22 NOTE — ED Notes (Signed)
Pt noted pacing the hallway through out the shift. Pt guarded, forwards little with this nurse. Pt compliant with medication regimen. Pt endorsing HI towards "evil people" Special checks q 15 mins in place for safety. Video monitoring in place.

## 2015-12-22 NOTE — ED Notes (Signed)
Pt A&O x 3, no distress noted, calm & cooperative at present, watching TV at present.  Monitoring for safety, Q 15 min checks in effect. 

## 2015-12-22 NOTE — ED Notes (Signed)
Pt pacing in room with fist balled up and cursing.  Zyprexa po offered, pt refused and stated hold him down and give him an injection.  Pt then requested a snack, calm at present, will continue to monitor.

## 2015-12-22 NOTE — ED Notes (Signed)
Pt calm & resting at present.

## 2015-12-22 NOTE — Progress Notes (Signed)
8:43am- [facility]. Psychiatry Team updated.   Posey ReaLaVonia Nicolaus Andel, LCSWA Clinical Social Worker [phone] 8:45 AM

## 2015-12-22 NOTE — Progress Notes (Signed)
12/22/15 1340:  Pt was sleep during recreation time.   Caroll RancherMarjette Jalysa Swopes, LRT/CTRS

## 2015-12-23 DIAGNOSIS — F17210 Nicotine dependence, cigarettes, uncomplicated: Secondary | ICD-10-CM | POA: Diagnosis not present

## 2015-12-23 DIAGNOSIS — F3163 Bipolar disorder, current episode mixed, severe, without psychotic features: Secondary | ICD-10-CM | POA: Diagnosis not present

## 2015-12-23 DIAGNOSIS — Z9889 Other specified postprocedural states: Secondary | ICD-10-CM | POA: Diagnosis not present

## 2015-12-23 DIAGNOSIS — Z79899 Other long term (current) drug therapy: Secondary | ICD-10-CM | POA: Diagnosis not present

## 2015-12-23 NOTE — ED Notes (Signed)
Pt discharged ambulatory with [employer].  Pt was calm and cooperative.  All belongings were sent with patient.
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
## 2015-12-23 NOTE — BH Assessment (Signed)
[facility] Assessment Progress Note  [facility]36 [facility].  Doylene Canninghomas Flordia Kassem, MA Triage Specialist [phone]

## 2015-12-23 NOTE — ED Notes (Signed)
Pt A&O x 3, no distress noted, calm & cooperative at present.  Monitoring for safety, Q 15 min checks in effect. 

## 2015-12-23 NOTE — ED Notes (Signed)
Pt became angry this morning when he had bacon on his breakfast tray.  Although we informed him that we ordered a fresh tray he threw his tray out in the hall.  I spoke with patient as I gave him his morning medication , he agreed to try to control his anger and he took his medicine willingly.  15 minute checks and video monitoring in place.

## 2015-12-23 NOTE — ED Notes (Addendum)
Pt is not discharged ,  Discharge notes were charted in error.

## 2015-12-23 NOTE — [place] FL2 (Signed)
  [place] MEDICAID FL2 LEVEL OF CARE SCREENING TOOL     IDENTIFICATION  Patient Name: Andrew Mathews Birthdate: 02/22/1985 Sex: male Admission Date (Current Location): 12/11/2015  [facility] [place]   Facility and Address:  [facility],  [address], [place] [id number]      Provider Number: [id number]  Attending Physician Name and Address:  Provider Default, MD  Relative Name and Phone Number:       Current Level of Care: Hospital Recommended Level of Care: Family Care Home Prior Approval Number:    Date Approved/Denied:   PASRR Number:    Discharge Plan: Other (Comment) (family care home )    Current Diagnoses: Patient Active Problem List   Diagnosis Date Noted  . Bipolar 1 disorder, mixed, severe (HCC) 12/16/2015    Orientation RESPIRATION BLADDER Height & Weight     Self, Time, Situation, Place  Normal Continent Weight:   Height:     BEHAVIORAL SYMPTOMS/MOOD NEUROLOGICAL BOWEL NUTRITION STATUS      Continent Diet (regular)  AMBULATORY STATUS COMMUNICATION OF NEEDS Skin   Independent Verbally Normal                       Personal Care Assistance Level of Assistance  Bathing, Feeding Bathing Assistance: Independent Feeding assistance: Independent Dressing Assistance: Independent     Functional Limitations Info             SPECIAL CARE FACTORS FREQUENCY                       Contractures      Additional Factors Info  Code Status, Allergies Code Status Info: Not on file Allergies Info: PROLIXIN FLUPHENAZINE, Divalproex Sodium VALPROIC ACID, HALDOL HALOPERIDOL, WELLBUTRIN BUPROPION            Current Medications (12/23/2015):  This is the current hospital active medication list Current Facility-Administered Medications  Medication Dose Route Frequency Provider Last Rate Last Dose  . amantadine (SYMMETREL) capsule 100 mg  100 mg Oral BID Thedore MinsMojeed Akintayo, MD   100 mg at 12/23/15 1022  .  hydrOXYzine (ATARAX/VISTARIL) tablet 25 mg  25 mg Oral TID Thedore MinsMojeed Akintayo, MD   25 mg at 12/23/15 1624  . lithium carbonate capsule 600 mg  600 mg Oral BID WC Mojeed Akintayo, MD   600 mg at 12/23/15 1624  . OLANZapine (ZYPREXA) tablet 10 mg  10 mg Oral BID PC Mojeed Akintayo, MD   10 mg at 12/23/15 1818  . OLANZapine zydis (ZYPREXA) disintegrating tablet 10 mg  10 mg Oral Q8H PRN Thedore MinsMojeed Akintayo, MD   10 mg at 12/21/15 1604   Current Outpatient Prescriptions  Medication Sig Dispense Refill  . amphetamine-dextroamphetamine (ADDERALL) 15 MG tablet Take 15 mg by mouth every morning.     . chlorproMAZINE (THORAZINE) 50 MG tablet Take 50 mg by mouth 2 (two) times daily.    Marland Kitchen. lithium 600 MG capsule Take 1,200 mg by mouth at bedtime.    Marland Kitchen. OLANZapine (ZYPREXA) 15 MG tablet Take 15 mg by mouth at bedtime.       Discharge Medications: Please see discharge summary for a list of discharge medications.  Relevant Imaging Results:  Relevant Lab Results:   Additional Information    Andrew CoffinErin M Yamileth Hayse, LCSW

## 2015-12-23 NOTE — Progress Notes (Signed)
CSW spoke with Andrew Mathews, [phone], regarding patients discharge plan. Andrew Mathews works with [facility] group home and states he is able to accept patient once the FL2 and PASRR number are completed. Patient will also need a TB skin test with results read. CSW questioned if they could take patient without insurance and [name] stated patient does have Medicaid because he used it at his previous facility. Andrew Mathews stated he is familiar with [name] and will not need to do an assessment on patient. CSW will work on [employer], PASRR, and getting Medicaid information.   Andrew GardnerErin Brihanna Mathews, LCSWA Clinical Social Worker [phone]

## 2015-12-23 NOTE — ED Notes (Signed)
The last charting on discharge condition was charted on the wrong pt.  Pt is not for discharge at this time.

## 2015-12-24 MED ORDER — TUBERCULIN PPD 5 UNIT/0.1ML ID SOLN
5.0000 [IU] | Freq: Once | INTRADERMAL | Status: DC
  Administered 2015-12-24: 5 [IU] via INTRADERMAL
  Filled 2015-12-24: qty 0.1

## 2015-12-24 NOTE — Progress Notes (Signed)
12/24/15 1341:  LRT went to pt room to offer activities, pt was sleep.  1418:  Pt had woken up, LRT went to offer activities to pt.  Pt declined.  Caroll RancherMarjette Soledad Budreau, LRT/CTRS

## 2015-12-24 NOTE — ED Notes (Signed)
Patient has been pleasant and cooperative today.  He has been polite with all staff.  He has taken all medications and is attending to hygiene.  The over the bed table was removed from his room last week for negative behaviors.  The table was returned to him today for his positive changes.  This has worked well thus far.

## 2015-12-24 NOTE — ED Notes (Signed)
Patient pleasant and cooperative with care on assessment. Pt compliant with HS medications. No distress noted at this time.

## 2015-12-24 NOTE — Progress Notes (Addendum)
Last psychiatry note in system and [facility], [phone]. Confirmation page received.    Posey ReaLaVonia Yanissa Michalsky, LCSWA Clinical Social Worker [phone] 3:37 PM

## 2015-12-24 NOTE — ED Notes (Signed)
Andrew Mathews has been polite and cooperative all shift.  A PPD was placed this afternoon on his right forearm.  Advised we would need to read the results in two days before he could be accepted at the group home he was trying to get into.  He was able to verbalize understanding.

## 2015-12-24 NOTE — Progress Notes (Addendum)
8:24am- CSW attempted call to [name] with [facility] [phone]. No voicemail set up to leave voicemail.  9:02am- CSW spoke with [name] regarding patient. He stated patient has been with him off and on for the past five years. He stated he would want to see psychiatric notes, TB skin test results, and a current FL2. He stated he has two fax numbers [phone] or [phone]. Psychiatrist and NP updated.  CSW spoke with NP regarding TB Skin test this AM and Nurse states she will put in for patient to have test.  Attempted call to [name] to let him know that the TB Skin test was just placed today and will not be read until Saturday. Unable to leave message, no voicemail set up.    Andrew Mathews, LCSWA Clinical Social Worker [phone] 9:44 AM

## 2015-12-24 NOTE — Progress Notes (Signed)
CSW attempted to contact Andrew Mathews regarding TB skin test and FL2. [name]'s voicemail box was full-CSW was unable to leave message. Will attempt at a later time.   Stacy GardnerErin Bevan Disney, LCSWA Clinical Social Worker [phone]

## 2015-12-24 NOTE — [place] FL2 (Signed)
  [place] MEDICAID FL2 LEVEL OF CARE SCREENING TOOL     IDENTIFICATION  Patient Name: Andrew SabinMichael Condrey Birthdate: 06/20/1985 Sex: male Admission Date (Current Location): 12/11/2015  [facility] [place]   Facility and Address:  [facility],  [address], [place] [id number]      Provider Number: [id number]  Attending Physician Name and Address:  Provider Default, MD  Relative Name and Phone Number:       Current Level of Care: Hospital Recommended Level of Care: Other (Comment) (Group Home) Prior Approval Number:    Date Approved/Denied:   PASRR Number:    Discharge Plan: Other (Comment) (family care home )    Current Diagnoses: Patient Active Problem List   Diagnosis Date Noted  . Bipolar 1 disorder, mixed, severe (HCC) 12/16/2015    Orientation RESPIRATION BLADDER Height & Weight     Self, Time, Situation, Place  Normal Continent Weight:   Height:     BEHAVIORAL SYMPTOMS/MOOD NEUROLOGICAL BOWEL NUTRITION STATUS      Continent Diet (regular)  AMBULATORY STATUS COMMUNICATION OF NEEDS Skin   Independent Verbally Normal                       Personal Care Assistance Level of Assistance  Bathing, Feeding Bathing Assistance: Independent Feeding assistance: Independent Dressing Assistance: Independent     Functional Limitations Info             SPECIAL CARE FACTORS FREQUENCY                       Contractures      Additional Factors Info  Code Status, Allergies Code Status Info: Not on file Allergies Info: PROLIXIN FLUPHENAZINE, Divalproex Sodium VALPROIC ACID, HALDOL HALOPERIDOL, WELLBUTRIN BUPROPION            Current Medications (12/24/2015):  This is the current hospital active medication list Current Facility-Administered Medications  Medication Dose Route Frequency Provider Last Rate Last Dose  . amantadine (SYMMETREL) capsule 100 mg  100 mg Oral BID Mojeed Akintayo, MD   100 mg at 12/24/15 1049   . hydrOXYzine (ATARAX/VISTARIL) tablet 25 mg  25 mg Oral TID Thedore MinsMojeed Akintayo, MD   25 mg at 12/24/15 1049  . lithium carbonate capsule 600 mg  600 mg Oral BID WC Mojeed Akintayo, MD   600 mg at 12/24/15 1048  . OLANZapine (ZYPREXA) tablet 10 mg  10 mg Oral BID PC Mojeed Akintayo, MD   10 mg at 12/24/15 1048  . OLANZapine zydis (ZYPREXA) disintegrating tablet 10 mg  10 mg Oral Q8H PRN Thedore MinsMojeed Akintayo, MD   10 mg at 12/21/15 1604  . tuberculin injection 5 Units  5 Units Intradermal Once Charm RingsJamison Y Lord, NP       Current Outpatient Prescriptions  Medication Sig Dispense Refill  . amphetamine-dextroamphetamine (ADDERALL) 15 MG tablet Take 15 mg by mouth every morning.     . chlorproMAZINE (THORAZINE) 50 MG tablet Take 50 mg by mouth 2 (two) times daily.    Marland Kitchen. lithium 600 MG capsule Take 1,200 mg by mouth at bedtime.    Marland Kitchen. OLANZapine (ZYPREXA) 15 MG tablet Take 15 mg by mouth at bedtime.       Discharge Medications:  Relevant Imaging Results:  Relevant Lab Results:   Additional Information    Claudean SeveranceLaVonia M Sherod Cisse, LCSW

## 2015-12-25 MED ORDER — AMANTADINE HCL 100 MG PO CAPS: 100.0000 mg | ORAL_CAPSULE | Freq: Two times a day (BID) | ORAL | 0 refills | Status: AC

## 2015-12-25 MED ORDER — OLANZAPINE 10 MG PO TABS: 10.0000 mg | ORAL_TABLET | Freq: Two times a day (BID) | ORAL | 0 refills | Status: AC

## 2015-12-25 MED ORDER — HYDROXYZINE HCL 25 MG PO TABS: 25.0000 mg | ORAL_TABLET | Freq: Three times a day (TID) | ORAL | 0 refills | Status: AC

## 2015-12-25 MED ORDER — LITHIUM CARBONATE 600 MG PO CAPS: 600.0000 mg | ORAL_CAPSULE | Freq: Two times a day (BID) | ORAL | 0 refills | Status: AC

## 2015-12-25 NOTE — Progress Notes (Signed)
CSW staffed case with Asst. Director of Social Work.  CSW spoke with the Psychiatrist to see if patient had been psychiatrically cleared and if he would be able to go to a shelter. Psychiatrist stated patient had been psychiatrically cleared and that he would be able to go to shelter or to see if he had any family.  CSW spoke with patient at bedside to see if he had family or to see if he had been to a shelter. Patient stated "I thought I was going with Ronnie". Patient was speaking of [name] with [facility]. CSW informed patient that attempts had been made to contact Mr. Broadus JohnWarren but unsuccessful at this time. Patient asked if he could call and CSW informed him to see if he could possibly reach him. Patient then asked if he could be discharged and that he would "try to reach him on the streets". CSW staffed with Psychiatrist and he stated patient could be discharged. NP and Nurse made aware. Nurse was given green book for free meals, another booklet to assist with meals, and homeless shelter resources to give to patient.    Posey ReaLaVonia Tyerra Loretto, LCSWA Clinical Social Worker [phone] 12:17 PM

## 2015-12-25 NOTE — Progress Notes (Addendum)
8:44am- CSW attempted call to [name] with [facility] [phone]. No voicemail set up to leave voicemail.  9:19am- CSW attempted call to [name] with [facility] [phone]. No voicemail set up to leave voicemail.  11:23am- CSW attempted call to [name] with [facility] [phone]. No voicemail set up to leave voicemail.     Posey ReaLaVonia Danyal Adorno, LCSWA Clinical Social Worker [phone] 8:46 AM

## 2015-12-25 NOTE — ED Notes (Signed)
Patient was sleeping per Nadeen Landaun Aleyza Salmi let patient rest

## 2016-02-18 ENCOUNTER — Emergency Department
Admission: EM | Admit: 2016-02-18 | Discharge: 2016-02-18 | Disposition: A | Discharge status: Home / Self Care | Payer: Medicaid Other | Attending: Emergency Medicine | Admitting: Emergency Medicine

## 2016-02-18 ENCOUNTER — Encounter: Payer: Self-pay | Admitting: Medical Oncology

## 2016-02-18 DIAGNOSIS — S40811A Abrasion of right upper arm, initial encounter: Secondary | ICD-10-CM | POA: Diagnosis not present

## 2016-02-18 DIAGNOSIS — Z0289 Encounter for other administrative examinations: Secondary | ICD-10-CM | POA: Diagnosis not present

## 2016-02-18 DIAGNOSIS — Z008 Encounter for other general examination: Secondary | ICD-10-CM

## 2016-02-18 DIAGNOSIS — Y9389 Activity, other specified: Secondary | ICD-10-CM | POA: Diagnosis not present

## 2016-02-18 DIAGNOSIS — Z79899 Other long term (current) drug therapy: Secondary | ICD-10-CM | POA: Diagnosis not present

## 2016-02-18 DIAGNOSIS — S40812A Abrasion of left upper arm, initial encounter: Secondary | ICD-10-CM | POA: Diagnosis not present

## 2016-02-18 DIAGNOSIS — F17200 Nicotine dependence, unspecified, uncomplicated: Secondary | ICD-10-CM | POA: Diagnosis not present

## 2016-02-18 DIAGNOSIS — Y929 Unspecified place or not applicable: Secondary | ICD-10-CM | POA: Insufficient documentation

## 2016-02-18 DIAGNOSIS — Y999 Unspecified external cause status: Secondary | ICD-10-CM | POA: Diagnosis not present

## 2016-02-18 DIAGNOSIS — S4992XA Unspecified injury of left shoulder and upper arm, initial encounter: Secondary | ICD-10-CM | POA: Diagnosis present

## 2016-02-18 DIAGNOSIS — T07XXXA Unspecified multiple injuries, initial encounter: Secondary | ICD-10-CM
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
## 2016-02-18 NOTE — ED Triage Notes (Signed)
Pt here under [employer] custody under arrest, needs medical clearance for jail.
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
